# Patient Record
Sex: Male | Born: 1965
Health system: Southern US, Community
[De-identification: ages and names within clinical notes are randomized; demographics above are authoritative.]

---

## 2013-04-26 ENCOUNTER — Ambulatory Visit (HOSPITAL_COMMUNITY)
Admission: RE | Admit: 2013-04-26 | Discharge: 2013-04-26 | Disposition: A | Discharge status: Home / Self Care | Payer: 59 | Source: Ambulatory Visit | Attending: Pulmonary Disease | Admitting: Pulmonary Disease

## 2013-04-26 ENCOUNTER — Other Ambulatory Visit (HOSPITAL_COMMUNITY): Payer: Self-pay | Admitting: Pulmonary Disease

## 2013-04-26 DIAGNOSIS — R079 Chest pain, unspecified: Secondary | ICD-10-CM

## 2013-04-26 DIAGNOSIS — R0789 Other chest pain: Secondary | ICD-10-CM | POA: Insufficient documentation

## 2016-12-23 DIAGNOSIS — M9902 Segmental and somatic dysfunction of thoracic region: Secondary | ICD-10-CM | POA: Diagnosis not present

## 2016-12-23 DIAGNOSIS — M9903 Segmental and somatic dysfunction of lumbar region: Secondary | ICD-10-CM | POA: Diagnosis not present

## 2016-12-23 DIAGNOSIS — M9905 Segmental and somatic dysfunction of pelvic region: Secondary | ICD-10-CM | POA: Diagnosis not present

## 2016-12-25 DIAGNOSIS — M9905 Segmental and somatic dysfunction of pelvic region: Secondary | ICD-10-CM | POA: Diagnosis not present

## 2016-12-25 DIAGNOSIS — M9903 Segmental and somatic dysfunction of lumbar region: Secondary | ICD-10-CM | POA: Diagnosis not present

## 2016-12-25 DIAGNOSIS — M9902 Segmental and somatic dysfunction of thoracic region: Secondary | ICD-10-CM | POA: Diagnosis not present

## 2017-01-28 DIAGNOSIS — M9905 Segmental and somatic dysfunction of pelvic region: Secondary | ICD-10-CM | POA: Diagnosis not present

## 2017-01-28 DIAGNOSIS — M9903 Segmental and somatic dysfunction of lumbar region: Secondary | ICD-10-CM | POA: Diagnosis not present

## 2017-01-28 DIAGNOSIS — M9902 Segmental and somatic dysfunction of thoracic region: Secondary | ICD-10-CM | POA: Diagnosis not present

## 2017-02-14 DIAGNOSIS — M9902 Segmental and somatic dysfunction of thoracic region: Secondary | ICD-10-CM | POA: Diagnosis not present

## 2017-02-14 DIAGNOSIS — M9903 Segmental and somatic dysfunction of lumbar region: Secondary | ICD-10-CM | POA: Diagnosis not present

## 2017-02-14 DIAGNOSIS — M9905 Segmental and somatic dysfunction of pelvic region: Secondary | ICD-10-CM | POA: Diagnosis not present

## 2017-03-28 DIAGNOSIS — M9902 Segmental and somatic dysfunction of thoracic region: Secondary | ICD-10-CM | POA: Diagnosis not present

## 2017-03-28 DIAGNOSIS — M9905 Segmental and somatic dysfunction of pelvic region: Secondary | ICD-10-CM | POA: Diagnosis not present

## 2017-03-28 DIAGNOSIS — M9903 Segmental and somatic dysfunction of lumbar region: Secondary | ICD-10-CM | POA: Diagnosis not present

## 2017-09-18 DIAGNOSIS — M25561 Pain in right knee: Secondary | ICD-10-CM | POA: Diagnosis not present

## 2017-09-18 DIAGNOSIS — Z125 Encounter for screening for malignant neoplasm of prostate: Secondary | ICD-10-CM | POA: Diagnosis not present

## 2017-09-18 DIAGNOSIS — E7841 Elevated Lipoprotein(a): Secondary | ICD-10-CM | POA: Diagnosis not present

## 2017-09-18 DIAGNOSIS — M79609 Pain in unspecified limb: Secondary | ICD-10-CM | POA: Diagnosis not present

## 2017-09-18 DIAGNOSIS — Z833 Family history of diabetes mellitus: Secondary | ICD-10-CM | POA: Diagnosis not present

## 2017-09-18 DIAGNOSIS — E7800 Pure hypercholesterolemia, unspecified: Secondary | ICD-10-CM | POA: Diagnosis not present

## 2017-10-08 ENCOUNTER — Ambulatory Visit (HOSPITAL_COMMUNITY)
Admission: RE | Admit: 2017-10-08 | Discharge: 2017-10-08 | Disposition: A | Discharge status: Home / Self Care | Payer: 59 | Source: Ambulatory Visit | Attending: Pulmonary Disease | Admitting: Pulmonary Disease

## 2017-10-08 ENCOUNTER — Other Ambulatory Visit (HOSPITAL_COMMUNITY): Payer: Self-pay | Admitting: Pulmonary Disease

## 2017-10-08 DIAGNOSIS — M25561 Pain in right knee: Secondary | ICD-10-CM

## 2017-11-11 DIAGNOSIS — Z1211 Encounter for screening for malignant neoplasm of colon: Secondary | ICD-10-CM | POA: Diagnosis not present

## 2018-01-02 DIAGNOSIS — M9904 Segmental and somatic dysfunction of sacral region: Secondary | ICD-10-CM | POA: Diagnosis not present

## 2018-01-02 DIAGNOSIS — M9905 Segmental and somatic dysfunction of pelvic region: Secondary | ICD-10-CM | POA: Diagnosis not present

## 2018-01-02 DIAGNOSIS — M9903 Segmental and somatic dysfunction of lumbar region: Secondary | ICD-10-CM | POA: Diagnosis not present

## 2018-03-25 DIAGNOSIS — Z1211 Encounter for screening for malignant neoplasm of colon: Secondary | ICD-10-CM | POA: Diagnosis not present

## 2018-03-25 DIAGNOSIS — D123 Benign neoplasm of transverse colon: Secondary | ICD-10-CM | POA: Diagnosis not present

## 2018-06-09 DIAGNOSIS — M9905 Segmental and somatic dysfunction of pelvic region: Secondary | ICD-10-CM | POA: Diagnosis not present

## 2018-06-09 DIAGNOSIS — M62452 Contracture of muscle, left thigh: Secondary | ICD-10-CM | POA: Diagnosis not present

## 2018-06-09 DIAGNOSIS — M9903 Segmental and somatic dysfunction of lumbar region: Secondary | ICD-10-CM | POA: Diagnosis not present

## 2018-06-15 DIAGNOSIS — M62452 Contracture of muscle, left thigh: Secondary | ICD-10-CM | POA: Diagnosis not present

## 2018-06-15 DIAGNOSIS — M9903 Segmental and somatic dysfunction of lumbar region: Secondary | ICD-10-CM | POA: Diagnosis not present

## 2018-06-15 DIAGNOSIS — M9905 Segmental and somatic dysfunction of pelvic region: Secondary | ICD-10-CM | POA: Diagnosis not present

## 2018-06-29 IMAGING — DX DG KNEE COMPLETE 4+V*R*
4 series · 4 of 4 positions shown · non-contrast
Comparison: None.

CLINICAL DATA: Acute right knee pain

EXAM:
RIGHT KNEE - COMPLETE 4+ VIEW

[w knee ap right (1 of 2)]
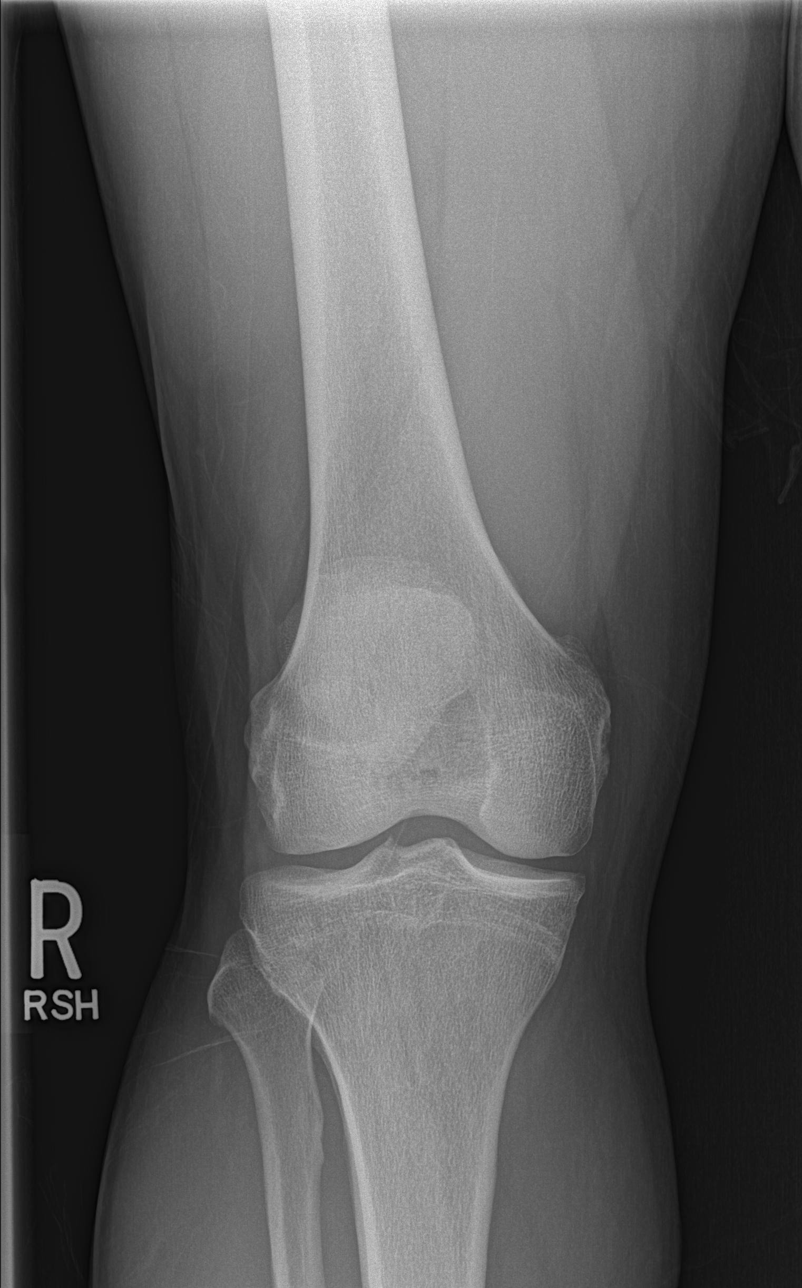

[w knee ap right (2 of 2)]
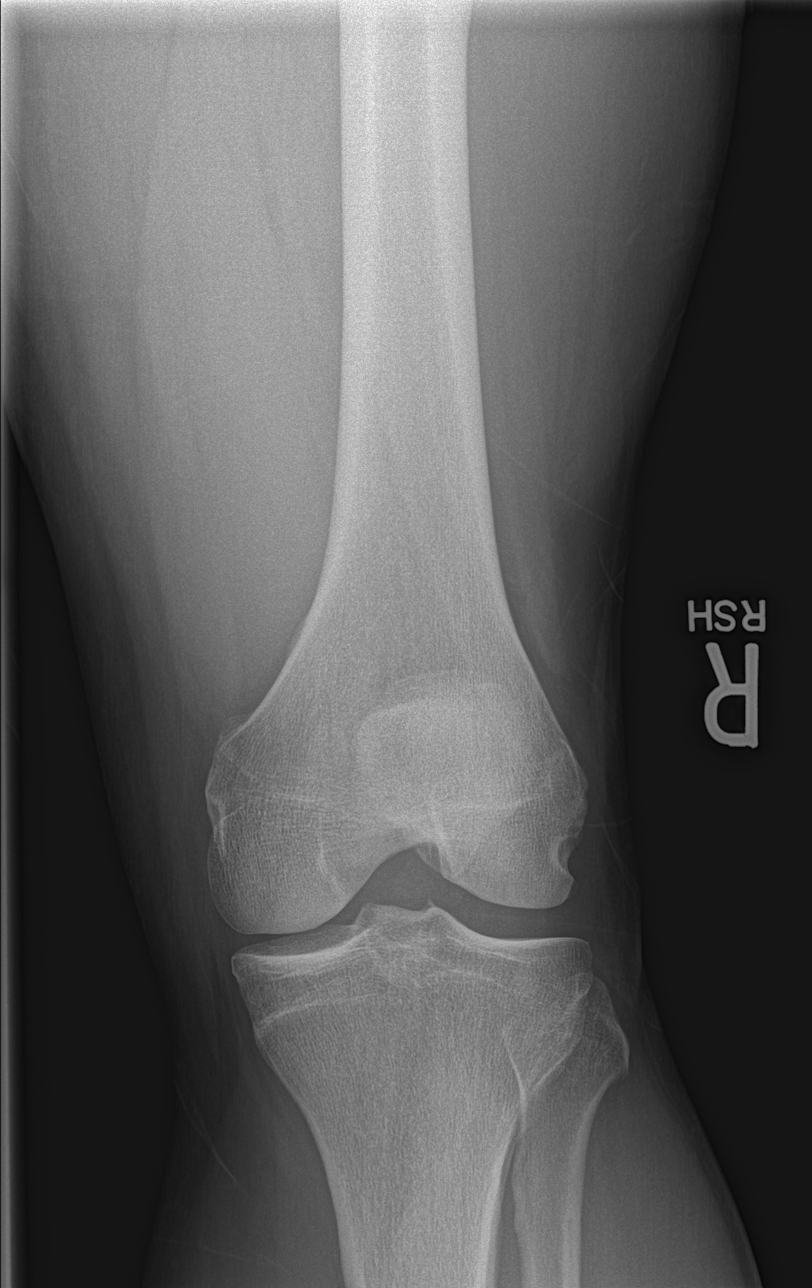

[x knee tunnel right]
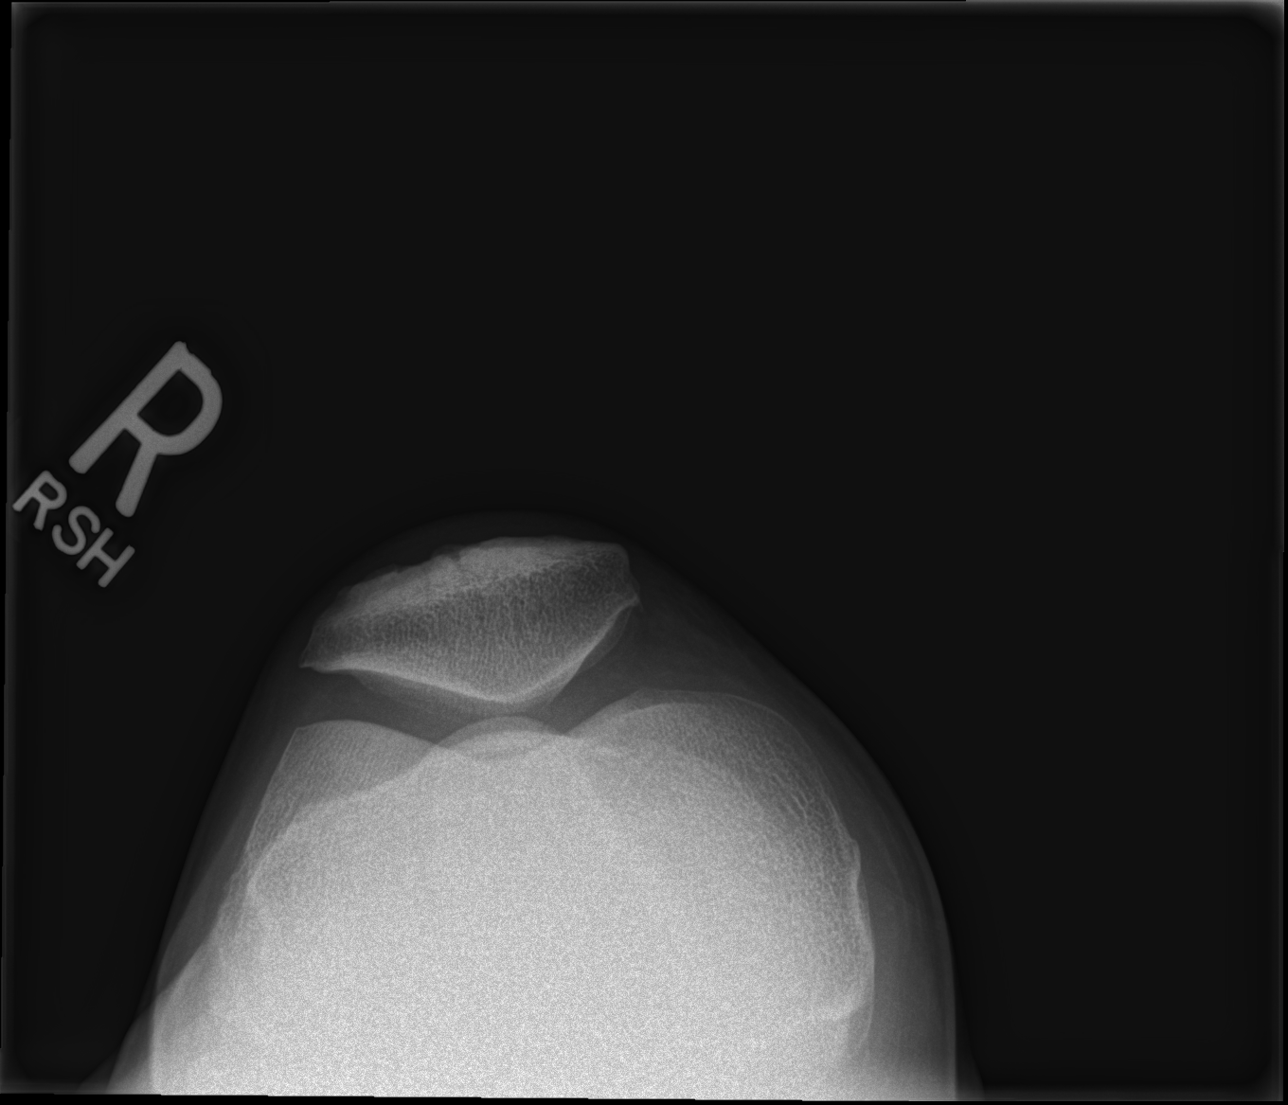

[t knee lat right]
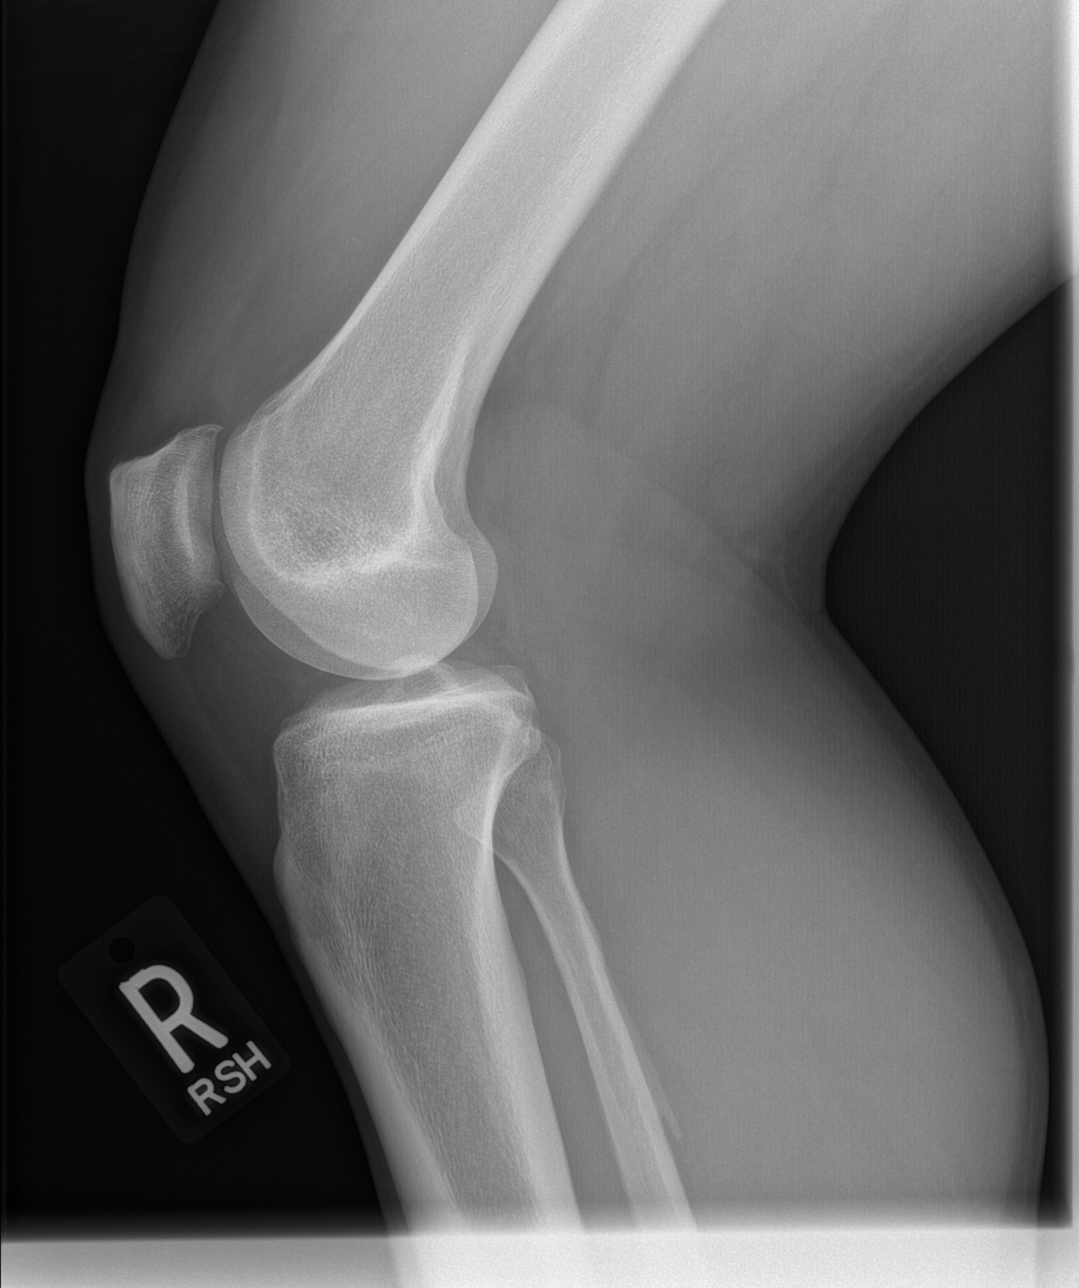

[4 of 4 positions shown; findings below may reference images not displayed]

FINDINGS: No evidence of fracture, dislocation, or joint effusion. No evidence
of arthropathy or other focal bone abnormality. Soft tissues are
unremarkable.
IMPRESSION: Negative.

## 2020-09-27 ENCOUNTER — Encounter (HOSPITAL_COMMUNITY): Payer: Self-pay

## 2020-09-27 ENCOUNTER — Ambulatory Visit (HOSPITAL_COMMUNITY)
Admission: EM | Admit: 2020-09-27 | Discharge: 2020-09-27 | Disposition: A | Discharge status: Home / Self Care | Payer: 59 | Attending: Urgent Care | Admitting: Urgent Care

## 2020-09-27 ENCOUNTER — Ambulatory Visit (INDEPENDENT_AMBULATORY_CARE_PROVIDER_SITE_OTHER): Payer: 59

## 2020-09-27 ENCOUNTER — Other Ambulatory Visit: Payer: Self-pay

## 2020-09-27 DIAGNOSIS — M25472 Effusion, left ankle: Secondary | ICD-10-CM | POA: Diagnosis not present

## 2020-09-27 DIAGNOSIS — M25572 Pain in left ankle and joints of left foot: Secondary | ICD-10-CM

## 2020-09-27 MED ORDER — NAPROXEN 500 MG PO TABS: 500.0000 mg | ORAL_TABLET | Freq: Two times a day (BID) | ORAL | 0 refills | Status: AC

## 2020-09-27 NOTE — ED Triage Notes (Addendum)
Pt in with c/o left ankle pain & swelling  that started this morning while he was standing making coffee.  Denies any recent injury or strenuous activity  States it feels like a cramp

## 2020-09-27 NOTE — ED Provider Notes (Signed)
Ronald Mathis   MRN: [id number] DOB: 1966-04-09  Subjective:   Ronald Mathis is a 55 y.o. male presenting for acute onset this morning of left ankle pain and swelling while he was standing and serving himself coffee. No trauma, falls, ankle sprains. Has not taken medications for relief.   No current facility-administered medications for this encounter. No current outpatient medications on file.   No Known Allergies  History reviewed. No pertinent past medical history.   History reviewed. No pertinent surgical history.  History reviewed. No pertinent family history.  Social History   Tobacco Use  . Smoking status: Never Smoker  . Smokeless tobacco: Never Used  Substance Use Topics  . Drug use: Never    ROS   Objective:   Vitals: BP 127/86   Pulse (!) 59   Temp 98 F (36.7 C)   Resp 19   SpO2 98%   Physical Exam Constitutional:      General: He is not in acute distress.    Appearance: Normal appearance. He is well-developed and normal weight. He is not ill-appearing, toxic-appearing or diaphoretic.  HENT:     Head: Normocephalic and atraumatic.     Right Ear: External ear normal.     Left Ear: External ear normal.     Nose: Nose normal.     Mouth/Throat:     Pharynx: Oropharynx is clear.  Eyes:     General: No scleral icterus.       Right eye: No discharge.        Left eye: No discharge.     Extraocular Movements: Extraocular movements intact.     Pupils: Pupils are equal, round, and reactive to light.  Cardiovascular:     Rate and Rhythm: Normal rate.  Pulmonary:     Effort: Pulmonary effort is normal.  Musculoskeletal:     Cervical back: Normal range of motion.     Left ankle: Swelling present. No deformity, ecchymosis or lacerations. Tenderness present over the lateral malleolus, medial malleolus, ATF ligament and AITF ligament. Decreased range of motion.     Left Achilles Tendon: No tenderness or defects. Thompson's test negative.      Left foot: Normal range of motion and normal capillary refill. Tenderness (plantar fascia with toes in dorsiflexion) present. No swelling, deformity, laceration, bony tenderness or crepitus.  Skin:    General: Skin is warm and dry.  Neurological:     Mental Status: He is alert and oriented to person, place, and time.     Motor: No weakness.     Coordination: Coordination normal.     Gait: Gait normal.     Deep Tendon Reflexes: Reflexes normal.  Psychiatric:        Mood and Affect: Mood normal.        Behavior: Behavior normal.        Thought Content: Thought content normal.        Judgment: Judgment normal.     DG Ankle Complete Left  Result Date: 09/27/2020 CLINICAL DATA:  Pain and swelling EXAM: LEFT ANKLE COMPLETE - 3+ VIEW COMPARISON:  None. FINDINGS: Frontal, oblique, lateral views were obtained. There is no acute fracture. There is evidence of old trauma in the medial malleolar region with well corticated calcification in this area. There is a focal ankle joint effusion. There is no appreciable joint space narrowing or erosion. Ankle mortise appears intact. IMPRESSION: There is a joint effusion which could have arthropathic etiology or potentially could  be indicative of ligamentous injury if there is appropriate trauma history. No acute fracture evident. Evidence of old trauma with remodeling in the medial malleolus. No appreciable joint space narrowing or erosion. Electronically Signed   By: Lowella Grip III M.D.   On: 09/27/2020 09:56    Assessment and Plan :   PDMP not reviewed this encounter.  1. Pain and swelling of left ankle   2. Acute left ankle pain     Will manage for his ankle pain which may be related to inflammatory process due to over-use in setting of a remote ankle injury. Recommended rice method, NSAID. Counseled patient on potential for adverse effects with medications prescribed/recommended today, ER and return-to-clinic precautions discussed, patient  verbalized understanding.    Jaynee Eagles, PA-C 09/27/20 1009

## 2021-06-18 IMAGING — DX DG ANKLE COMPLETE 3+V*L*
3 series · 3 of 3 positions shown · non-contrast
Comparison: None.

CLINICAL DATA: Pain and swelling

EXAM:
LEFT ANKLE COMPLETE - 3+ VIEW

[ankle ap]
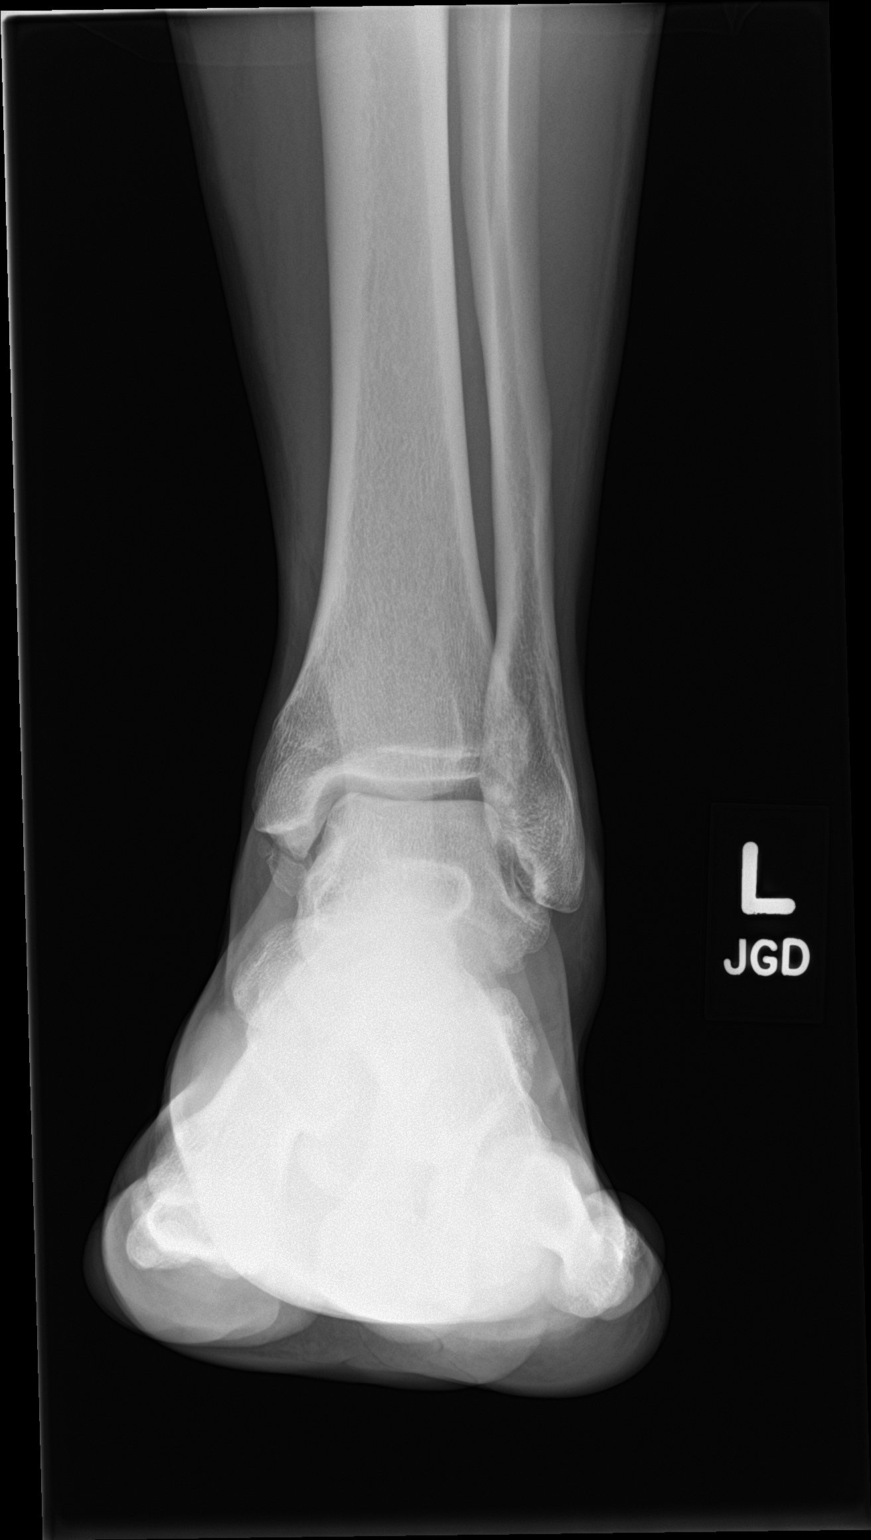

[ankle obl]
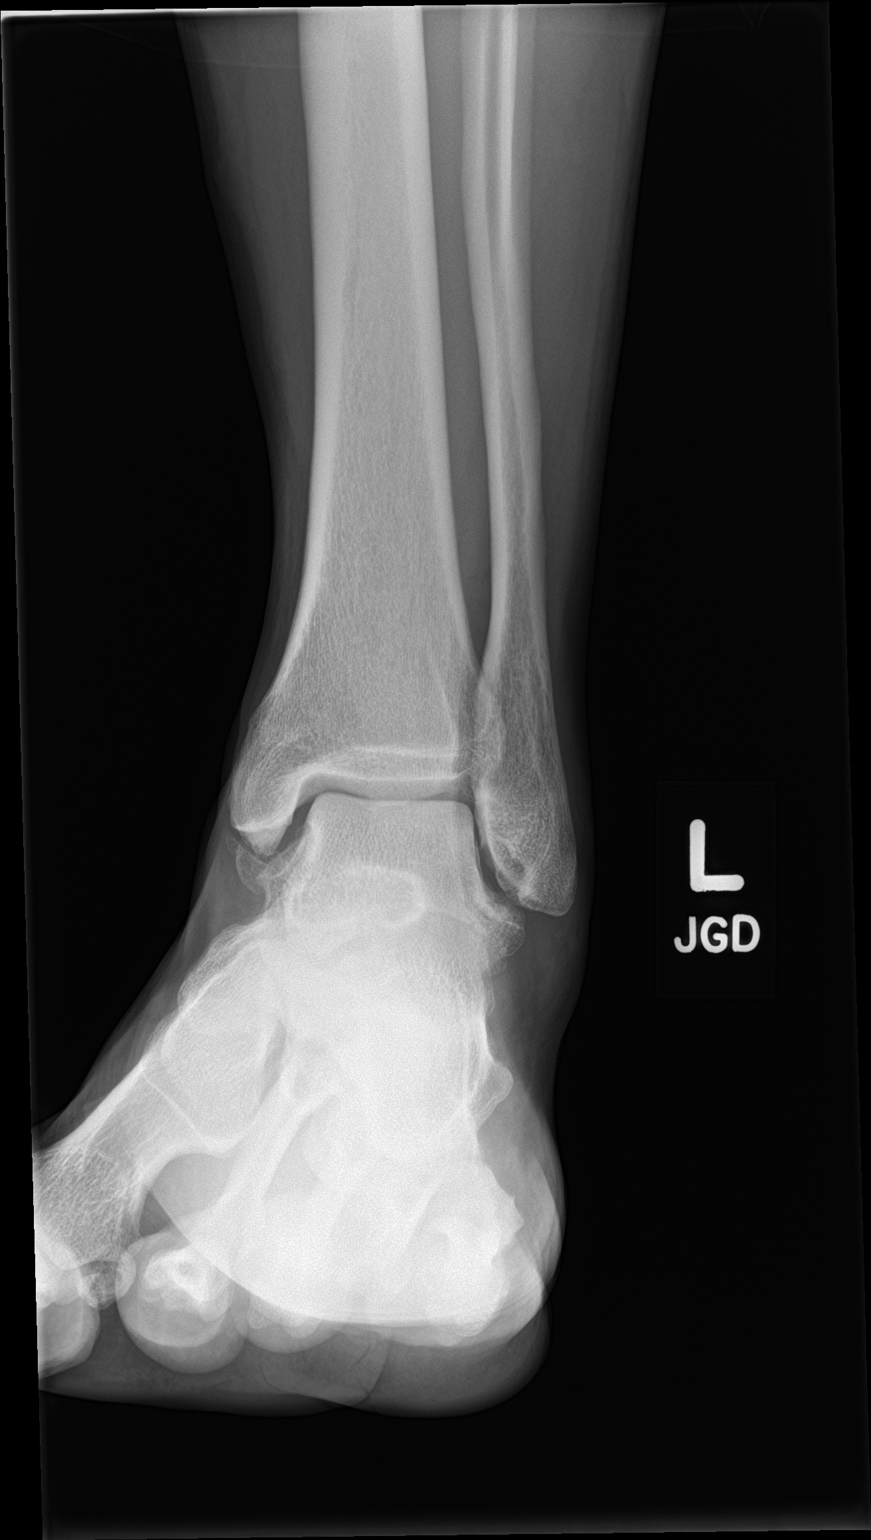

[ankle lat]
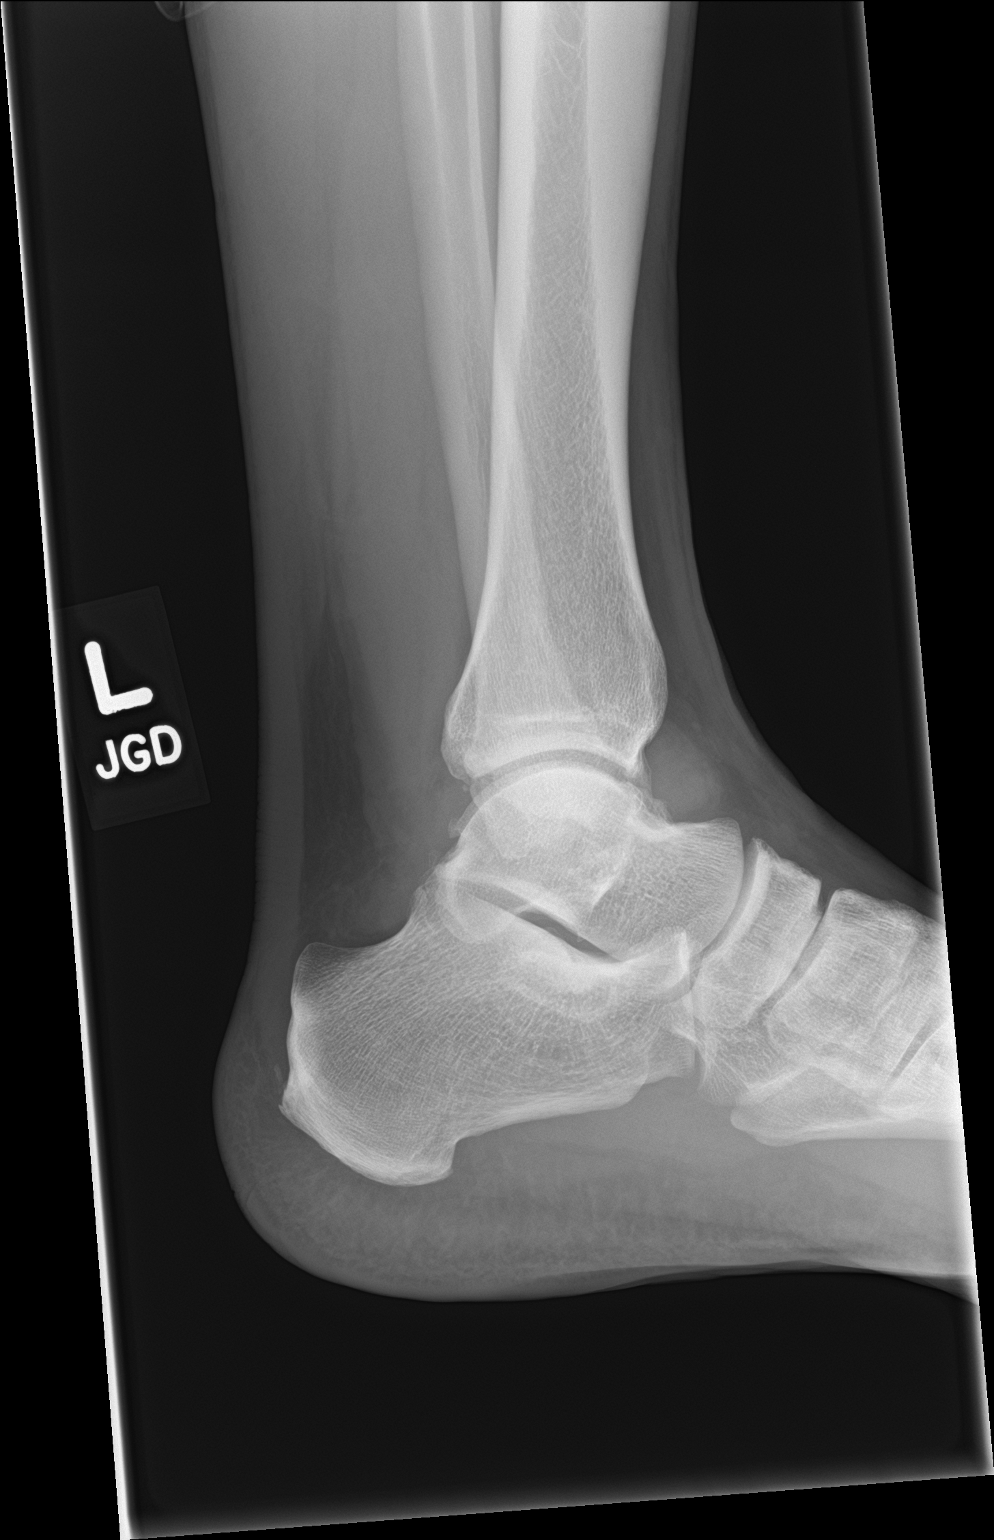

[3 of 3 positions shown; findings below may reference images not displayed]

FINDINGS: Frontal, oblique, lateral views were obtained. There is no acute
fracture. There is evidence of old trauma in the medial malleolar
region with well corticated calcification in this area. There is a
focal ankle joint effusion. There is no appreciable joint space
narrowing or erosion. Ankle mortise appears intact.
IMPRESSION: There is a joint effusion which could have arthropathic etiology or
potentially could be indicative of ligamentous injury if there is
appropriate trauma history. No acute fracture evident. Evidence of
old trauma with remodeling in the medial malleolus. No appreciable
joint space narrowing or erosion.
